# Patient Record
Sex: Male | Born: 1955 | Race: White | Hispanic: No | Marital: Married | Smoking: Never smoker
Health system: Southern US, Community
[De-identification: ages and names within clinical notes are randomized; demographics above are authoritative.]

## PROBLEM LIST (undated history)

## (undated) DIAGNOSIS — W868XXA Exposure to other electric current, initial encounter: Secondary | ICD-10-CM

## (undated) DIAGNOSIS — G4730 Sleep apnea, unspecified: Secondary | ICD-10-CM

## (undated) DIAGNOSIS — T300 Burn of unspecified body region, unspecified degree: Secondary | ICD-10-CM

## (undated) DIAGNOSIS — I10 Essential (primary) hypertension: Secondary | ICD-10-CM

## (undated) DIAGNOSIS — E7800 Pure hypercholesterolemia, unspecified: Secondary | ICD-10-CM

## (undated) HISTORY — PX: CATARACT EXTRACTION: SUR2

## (undated) HISTORY — PX: OTHER SURGICAL HISTORY: SHX169

## (undated) HISTORY — PX: TENDON REPAIR: SHX5111

## (undated) HISTORY — PX: CARPAL TUNNEL RELEASE: SHX101

## (undated) HISTORY — PX: SHOULDER SURGERY: SHX246

## (undated) HISTORY — PX: HIP SURGERY: SHX245

---

## 1998-09-11 ENCOUNTER — Ambulatory Visit: Admission: RE | Admit: 1998-09-11 | Discharge: 1998-09-11 | Payer: Self-pay | Admitting: Family Medicine

## 2000-01-11 ENCOUNTER — Emergency Department (HOSPITAL_COMMUNITY): Admission: EM | Admit: 2000-01-11 | Discharge: 2000-01-12 | Payer: Self-pay | Admitting: Emergency Medicine

## 2002-09-23 DIAGNOSIS — T300 Burn of unspecified body region, unspecified degree: Secondary | ICD-10-CM

## 2002-09-23 HISTORY — DX: Burn of unspecified body region, unspecified degree: T30.0

## 2003-07-12 ENCOUNTER — Emergency Department (HOSPITAL_COMMUNITY): Admission: AC | Admit: 2003-07-12 | Discharge: 2003-07-12 | Payer: Self-pay

## 2003-07-12 ENCOUNTER — Encounter: Payer: Self-pay | Admitting: Emergency Medicine

## 2003-09-24 HISTORY — PX: BACK SURGERY: SHX140

## 2004-01-22 HISTORY — PX: OTHER SURGICAL HISTORY: SHX169

## 2006-08-06 ENCOUNTER — Ambulatory Visit (HOSPITAL_COMMUNITY): Admission: RE | Admit: 2006-08-06 | Discharge: 2006-08-06 | Payer: Self-pay | Admitting: Gastroenterology

## 2007-02-22 HISTORY — PX: OTHER SURGICAL HISTORY: SHX169

## 2007-03-12 ENCOUNTER — Ambulatory Visit (HOSPITAL_BASED_OUTPATIENT_CLINIC_OR_DEPARTMENT_OTHER): Admission: RE | Admit: 2007-03-12 | Discharge: 2007-03-12 | Payer: Self-pay | Admitting: Orthopedic Surgery

## 2008-06-08 ENCOUNTER — Emergency Department (HOSPITAL_COMMUNITY): Admission: EM | Admit: 2008-06-08 | Discharge: 2008-06-08 | Payer: Self-pay | Admitting: Emergency Medicine

## 2008-09-23 HISTORY — PX: MENISCUS REPAIR: SHX5179

## 2009-11-08 ENCOUNTER — Ambulatory Visit (HOSPITAL_BASED_OUTPATIENT_CLINIC_OR_DEPARTMENT_OTHER): Admission: RE | Admit: 2009-11-08 | Discharge: 2009-11-08 | Payer: Self-pay | Admitting: Family Medicine

## 2009-11-19 ENCOUNTER — Ambulatory Visit: Payer: Self-pay | Admitting: Internal Medicine

## 2010-09-01 ENCOUNTER — Ambulatory Visit (HOSPITAL_COMMUNITY)
Admission: RE | Admit: 2010-09-01 | Discharge: 2010-09-02 | Payer: Self-pay | Source: Home / Self Care | Attending: Orthopedic Surgery | Admitting: Orthopedic Surgery

## 2010-12-03 LAB — GLUCOSE, CAPILLARY
Glucose-Capillary: 133 mg/dL — ABNORMAL HIGH (ref 70–99)
Glucose-Capillary: 144 mg/dL — ABNORMAL HIGH (ref 70–99)

## 2010-12-03 LAB — COMPREHENSIVE METABOLIC PANEL
ALT: 61 U/L — ABNORMAL HIGH (ref 0–53)
AST: 35 U/L (ref 0–37)
Albumin: 4.3 g/dL (ref 3.5–5.2)
Alkaline Phosphatase: 51 U/L (ref 39–117)
BUN: 14 mg/dL (ref 6–23)
CO2: 27 mEq/L (ref 19–32)
Calcium: 9.5 mg/dL (ref 8.4–10.5)
Chloride: 106 mEq/L (ref 96–112)
Creatinine, Ser: 0.93 mg/dL (ref 0.4–1.5)
GFR calc Af Amer: >60 mL/min (ref >60)
GFR calc non Af Amer: >60 mL/min (ref >60)
Glucose, Bld: 127 mg/dL — ABNORMAL HIGH (ref 70–99)
Potassium: 4.4 mEq/L (ref 3.5–5.1)
Sodium: 139 mEq/L (ref 135–145)
Total Bilirubin: 0.8 mg/dL (ref 0.3–1.2)
Total Protein: 6.6 g/dL (ref 6.0–8.3)

## 2010-12-03 LAB — CBC
HCT: 40.5 % (ref 39.0–52.0)
Hemoglobin: 14.2 g/dL (ref 13.0–17.0)
MCH: 30.2 pg (ref 26.0–34.0)
MCHC: 35.1 g/dL (ref 30.0–36.0)
MCV: 86.2 fL (ref 78.0–100.0)
Platelets: 225 10*3/uL (ref 150–400)
RBC: 4.7 MIL/uL (ref 4.22–5.81)
RDW: 13.2 % (ref 11.5–15.5)
WBC: 5.3 10*3/uL (ref 4.0–10.5)

## 2010-12-03 LAB — DIFFERENTIAL
Basophils Absolute: 0 10*3/uL (ref 0.0–0.1)
Basophils Relative: 1 % (ref 0–1)
Eosinophils Absolute: 0.1 10*3/uL (ref 0.0–0.7)
Eosinophils Relative: 1 % (ref 0–5)
Lymphocytes Relative: 36 % (ref 12–46)
Lymphs Abs: 1.9 10*3/uL (ref 0.7–4.0)
Monocytes Absolute: 0.7 10*3/uL (ref 0.1–1.0)
Monocytes Relative: 12 % (ref 3–12)
Neutro Abs: 2.7 10*3/uL (ref 1.7–7.7)
Neutrophils Relative %: 50 % (ref 43–77)

## 2010-12-03 LAB — PROTIME-INR
INR: 0.93 (ref 0.00–1.49)
Prothrombin Time: 12.7 seconds (ref 11.6–15.2)

## 2011-02-05 NOTE — Op Note (Signed)
[name], [name]                 ACCOUNT NO.:  [id number]   MEDICAL RECORD NO.:  [id number]          PATIENT TYPE:  AMB   LOCATION:  DSC                          FACILITY:  [facility]   PHYSICIAN:  Cindee Salt, M.D.       DATE OF BIRTH:  07-18-1956   DATE OF PROCEDURE:  03/12/2007  DATE OF DISCHARGE:                               OPERATIVE REPORT   PREOPERATIVE DIAGNOSIS:  Status post ulnar nerve decompression, right  elbow.   POSTOPERATIVE DIAGNOSIS:  Status post ulnar nerve decompression, right  elbow.   OPERATION:  Submuscular transposition, right elbow.   SURGEON:  Kuzma.   ASSISTANT:  __________ R.N.   ANESTHESIA:  General.   ANESTHESIOLOGIST:  Fitzgerald.   HISTORY:  The patient is a 55 year old male with a history of ulnar  wrist pain, ulnar pain in his hand and forearm.  He has undergone a  decompressive release of his ulnar nerve.  He has marked changes on his  nerve conductions with continued symptoms.  He is desirous of  submuscular transposition.  He is aware of risks and complications  including infection; recurrence; injury to arteries, nerves, tendons;  incomplete relief of symptoms; dystrophy; possibility of  devascularization of the nerve.  Questions were encouraged and answered.  In the preoperative area ,the patient is seen, questions again  encouraged and answered.  The extremity marked by both the patient and  surgeon.   PROCEDURE:  The patient was brought to the operating room where a  general anesthetic was carried out without difficulty, was prepped using  DuraPrep, supine position, right arm free.  The limb was exsanguinated  with an Esmarch bandage.  Tourniquet placed high on the arm, was  inflated to 300 mmHg.  A long curvilinear incision was made over the  medial aspect of the elbow, carried down through subcutaneous tissue.  Bleeders were electrocauterized.  Significant scarring was present about  the entire nerve.  This was then traced distally.   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  Stimulation of the nerve proximally produced  flexion of the ring and little fingers distally.  The dissection was  then carried distally.  Significant scarring was present about the  nerve.  After the neuroma was freed, the flexor carpi ulnaris was then  opened, a fasciotomy performed.  The medial intermuscular septum was  removed.  The medial musculature was then elevated off from the medial  epicondyle, taking care to protect the medial collateral ligament.  This  was done over to the level of the brachial artery and median nerve.  The  vessels present with the ulnar nerve had been sacrificed and were no  longer patent.  As such, the nerve was neurolysed.  This was anteriorly  transposed.  The drill holes were then placed in the medial epicondyle  for reattachment of the medial musculature.  This was then done with 2-0  FiberWire.  A flap of tissue scar from the posterior aspect of the  epicondyle was then used to reinforce the repair anteriorly in the  slightly lengthened position.  This allowed the nerve to be anteriorly  transposed.  Flexion/extension revealed adequate movement of the nerve  beneath the repair site.  The wound was copiously irrigated with saline.  The subcutaneous tissue was closed with interrupted 2-0 Vicryl sutures  and the skin with interrupted 4-0 Vicryl Rapide sutures.  Sterile  compressive dressing  and long-arm splint were applied.  The patient tolerated the procedure  well and was taken to the recovery observation in satisfactory  condition.  He will be discharged home to return to the [facility] in 1 week on Percocet.           ______________________________  Cindee Salt, M.D.     GK/MEDQ  D:  03/12/2007  T:  03/12/2007  Job:  [id number]   cc:   Cindee Salt, M.D.  Marjory Lies, M.D.

## 2011-02-08 NOTE — Op Note (Signed)
[name], [name]                 ACCOUNT NO.:  [id number]   MEDICAL RECORD NO.:  [id number]          PATIENT TYPE:  AMB   LOCATION:  ENDO                         FACILITY:  [facility]   PHYSICIAN:  Anselmo Rod, M.D.  DATE OF BIRTH:  07/05/1956   DATE OF PROCEDURE:  08/06/2006  DATE OF DISCHARGE:                                 OPERATIVE REPORT   PROCEDURE PERFORMED:  Screening colonoscopy.   ENDOSCOPIST:  Anselmo Rod, M.D.   INSTRUMENT USED:  Olympus colonoscope.   INDICATIONS FOR PROCEDURE:  55 year old white male with a history of rectal  bleeding undergoing screening colonoscopy to rule out colonic polyps,  masses, etc.   PREPROCEDURE PREPARATION:  Informed consent was procured from the patient.  The patient was fasted for 8 hours prior to the procedure and prepped with a  gallon of Trilyte the night prior to the procedure.  The risks and benefits  of the procedure including a 10% miss rate of cancer and polyps were  discussed with the patient, as well.   PREPROCEDURE PHYSICAL:  The patient had stable vital signs.  Chest clear to  auscultation.  S1 and S2 regular.  Abdomen soft with normal bowel sounds.   DESCRIPTION OF PROCEDURE:  The patient was placed in the left lateral  decubitus position and sedated with 125 mcg of fentanyl and 14 mg of Versed  given intravenously in slow incremental doses.  Once the patient was  adequately sedated, maintained on low-flow oxygen and continuous cardiac  monitoring, the Olympus video colonoscope was advanced from the rectum to  the cecum.  There was a large amount of residual stool in the colon,  multiple washes were done.  The patient's position was changed from the left  lateral to the supine and the left lateral position with gentle application  of abdominal pressure to reach the cecal base.  The appendiceal orifice and  ileocecal valve were clearly visualized and photographed.  No masses,  polyps, erosions, ulcers, or  diverticula seen.  Small internal hemorrhoids  were seen on retroflexion in the rectum.  The patient tolerated the  procedure well without immediate complications.   IMPRESSION:  Normal colonoscopy except for small internal hemorrhoids seen  on retroflexion, no masses, polyps, or diverticula noted.   RECOMMENDATIONS:  1. Continue a high fiber diet liberal fluid intake.  2. Repeat colonoscopy in the next five years unless the patient develops      any abnormal symptoms in the interim.  3. Outpatient follow-up in the next two weeks for further recommendations.      Anselmo Rod, M.D.  Electronically Signed     JNM/MEDQ  D:  08/06/2006  T:  08/07/2006  Job:  [id number]   cc:   Marjory Lies, M.D.

## 2011-07-10 LAB — BASIC METABOLIC PANEL
CO2: 28
Glucose, Bld: 111 — ABNORMAL HIGH
Potassium: 4.7
Sodium: 140

## 2012-03-15 ENCOUNTER — Encounter (HOSPITAL_COMMUNITY): Payer: Self-pay | Admitting: Emergency Medicine

## 2012-03-15 ENCOUNTER — Emergency Department (HOSPITAL_COMMUNITY): Payer: 59

## 2012-03-15 ENCOUNTER — Emergency Department (HOSPITAL_COMMUNITY)
Admission: EM | Admit: 2012-03-15 | Discharge: 2012-03-16 | Disposition: A | Discharge status: Home / Self Care | Payer: 59 | Attending: Emergency Medicine | Admitting: Emergency Medicine

## 2012-03-15 DIAGNOSIS — Z7982 Long term (current) use of aspirin: Secondary | ICD-10-CM | POA: Insufficient documentation

## 2012-03-15 DIAGNOSIS — M25519 Pain in unspecified shoulder: Secondary | ICD-10-CM | POA: Insufficient documentation

## 2012-03-15 DIAGNOSIS — C787 Secondary malignant neoplasm of liver and intrahepatic bile duct: Secondary | ICD-10-CM | POA: Insufficient documentation

## 2012-03-15 DIAGNOSIS — I10 Essential (primary) hypertension: Secondary | ICD-10-CM | POA: Insufficient documentation

## 2012-03-15 DIAGNOSIS — C801 Malignant (primary) neoplasm, unspecified: Secondary | ICD-10-CM | POA: Insufficient documentation

## 2012-03-15 DIAGNOSIS — E7800 Pure hypercholesterolemia, unspecified: Secondary | ICD-10-CM | POA: Insufficient documentation

## 2012-03-15 DIAGNOSIS — E119 Type 2 diabetes mellitus without complications: Secondary | ICD-10-CM | POA: Insufficient documentation

## 2012-03-15 DIAGNOSIS — G8929 Other chronic pain: Secondary | ICD-10-CM | POA: Insufficient documentation

## 2012-03-15 DIAGNOSIS — Z79899 Other long term (current) drug therapy: Secondary | ICD-10-CM | POA: Insufficient documentation

## 2012-03-15 DIAGNOSIS — R109 Unspecified abdominal pain: Secondary | ICD-10-CM

## 2012-03-15 DIAGNOSIS — R1011 Right upper quadrant pain: Secondary | ICD-10-CM | POA: Insufficient documentation

## 2012-03-15 HISTORY — DX: Burn of unspecified body region, unspecified degree: T30.0

## 2012-03-15 HISTORY — DX: Pure hypercholesterolemia, unspecified: E78.00

## 2012-03-15 HISTORY — DX: Essential (primary) hypertension: I10

## 2012-03-15 HISTORY — DX: Exposure to other electric current, initial encounter: W86.8XXA

## 2012-03-15 LAB — CBC
Hemoglobin: 11.2 g/dL — ABNORMAL LOW (ref 13.0–17.0)
RBC: 3.88 MIL/uL — ABNORMAL LOW (ref 4.22–5.81)

## 2012-03-15 LAB — URINALYSIS, ROUTINE W REFLEX MICROSCOPIC
Glucose, UA: NEGATIVE mg/dL
Hgb urine dipstick: NEGATIVE
Leukocytes, UA: NEGATIVE
Specific Gravity, Urine: 1.022 (ref 1.005–1.030)
Urobilinogen, UA: 1 mg/dL (ref 0.0–1.0)

## 2012-03-15 LAB — DIFFERENTIAL
Basophils Relative: 1 % (ref 0–1)
Lymphs Abs: 1.4 10*3/uL (ref 0.7–4.0)
Monocytes Relative: 12 % (ref 3–12)
Neutro Abs: 5.8 10*3/uL (ref 1.7–7.7)
Neutrophils Relative %: 70 % (ref 43–77)

## 2012-03-15 LAB — COMPREHENSIVE METABOLIC PANEL
BUN: 17 mg/dL (ref 6–23)
Calcium: 9.1 mg/dL (ref 8.4–10.5)
Creatinine, Ser: 0.78 mg/dL (ref 0.50–1.35)
GFR calc Af Amer: >90 mL/min (ref >90)
Glucose, Bld: 111 mg/dL — ABNORMAL HIGH (ref 70–99)
Sodium: 138 mEq/L (ref 135–145)
Total Protein: 6.6 g/dL (ref 6.0–8.3)

## 2012-03-15 LAB — LIPASE, BLOOD: Lipase: 18 U/L (ref 11–59)

## 2012-03-15 MED ORDER — SODIUM CHLORIDE 0.9 % IV BOLUS (SEPSIS)
500.0000 mL | Freq: Once | INTRAVENOUS | Status: AC
  Administered 2012-03-15: 500 mL via INTRAVENOUS

## 2012-03-15 MED ORDER — ONDANSETRON HCL 4 MG/2ML IJ SOLN
4.0000 mg | Freq: Once | INTRAMUSCULAR | Status: AC
  Administered 2012-03-15: 4 mg via INTRAVENOUS
  Filled 2012-03-15: qty 2

## 2012-03-15 MED ORDER — HYDROMORPHONE HCL PF 2 MG/ML IJ SOLN
2.0000 mg | Freq: Once | INTRAMUSCULAR | Status: AC
  Administered 2012-03-15: 2 mg via INTRAVENOUS
  Filled 2012-03-15: qty 1

## 2012-03-15 NOTE — ED Notes (Signed)
Reports R shoulder pain that started yesterday at 4am.  States pain gradually moved down to R ribs/R side and into R abd.  Denies nausea and vomiting.
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
## 2012-03-15 NOTE — ED Notes (Signed)
Pt arrived to room at this time

## 2012-03-15 NOTE — ED Notes (Signed)
Patient transported to Ultrasound 

## 2012-03-16 ENCOUNTER — Other Ambulatory Visit (HOSPITAL_COMMUNITY): Payer: Self-pay | Admitting: Family Medicine

## 2012-03-16 ENCOUNTER — Emergency Department (HOSPITAL_COMMUNITY): Payer: 59

## 2012-03-16 DIAGNOSIS — R945 Abnormal results of liver function studies: Secondary | ICD-10-CM

## 2012-03-16 MED ORDER — HYDROCODONE-ACETAMINOPHEN 5-325 MG PO TABS
2.0000 | ORAL_TABLET | Freq: Once | ORAL | Status: AC
  Administered 2012-03-16: 2 via ORAL
  Filled 2012-03-16: qty 2

## 2012-03-16 MED ORDER — ALPRAZOLAM 0.25 MG PO TABS
1.0000 mg | ORAL_TABLET | Freq: Once | ORAL | Status: AC
  Administered 2012-03-16: 1 mg via ORAL
  Filled 2012-03-16: qty 4

## 2012-03-16 MED ORDER — ALPRAZOLAM 0.5 MG PO TABS: 0.5000 mg | ORAL_TABLET | Freq: Every evening | ORAL | Status: AC | PRN

## 2012-03-16 MED ORDER — HYDROCODONE-ACETAMINOPHEN 5-325 MG PO TABS: 2.0000 | ORAL_TABLET | ORAL | Status: AC | PRN

## 2012-03-16 NOTE — ED Provider Notes (Signed)
Medical screening examination/treatment/procedure(s) were performed by non-physician practitioner and as supervising physician I was immediately available for consultation/collaboration.   Joya Gaskins, MD 03/16/12 [phone]

## 2012-03-16 NOTE — ED Notes (Signed)
Pt denies any pain or questions upon discharge, pt verbalizes understanding of no driving to medications given and prescriptions.

## 2012-03-16 NOTE — Discharge Instructions (Signed)
The ultrasound findings that are highly suggestive of metastatic liver cancer needs to be followed up closely and more thoroughly by your primary care provider and specialists for further evaluation and management. The information below addresses facts about liver cancer in general however it is an impossible to tell based on ultrasound findings an exact diagnosis. Return to emergency department at any time for emergent changing or worsening symptoms.  Liver Cancer Cells divide to form new cells when the body needs them. That happens in the liver, just as it does in other organs. Sometimes, the cells divide too rapidly. Old cells do not die off, and the process gets out of control. A growth (tumor) forms. That is how liver cancer starts. The liver is an important organ of the body. It is located on the upper right side of the belly (abdomen), just below the ribs. It is the largest organ in the body. In an adult man, it is about the size of a football. The liver stores sugar and iron. It also cleans (filters) harmful substances out of the blood. CAUSES  Scientists do not know exactly why cells start to divide rapidly in the liver to form a tumor. It is known that certain behaviors and conditions (risk factors) make liver cancer more likely to develop. They include:  Being male. Men older than 50 have liver cancer more often than other people do.   Having scarring of the liver (cirrhosis).   This occurs when liver cells are damaged and replaced with scar tissue.   Heavy drinking of alcohol for many years can cause cirrhosis, as well as being infected with the hepatitis B or hepatitis C virus (HBV or HCV). HBV and HCV are spread through blood and sexual contact.   Having certain liver diseases, such as:   Hemochromatosis. This disease causes too much iron to be stored in the liver.   Autoimmune hepatitis. In this disease, the body's immune system turns against the liver.   Wilson's disease. This  disease happens when copper builds up in the liver.   Having diabetes, particularly if you also drink alcohol heavily or have chronic viral hepatitis.   Being overweight (obese).   Exposure to alfatoxins. These are substances made by certain types of mold. They can form on peanuts, corn, wheat, soybeans, and rice. This is not a common cause of liver cancer in the [place] and [place] where foods are tested for alfatoxins.  SYMPTOMS  Often times, liver cancer often has few symptoms in the beginning. Sometimes, there are none. As the cancer grows, symptoms may include:  Weight loss without dieting.   Loss of appetite.   Nausea.   Feeling very weak and tired.   Pain on the right side of the belly (abdomen).   Feeling full or bloated.   Running a fever for no reason.   The skin or eyes become yellow in color (jaundice).   Dark-colored urine.  DIAGNOSIS  Your caregiver may suspect that you have liver cancer based on your symptoms and your physical exam. Other tests will likely be necessary. These may include:  Blood tests, including a test called alpha-fetoprotein (AFP). The blood contains more of this protein if someone has liver cancer.   Imaging tests. These tests may be able to detect cancer or a tumor. These tests include CT scan, MRI scan, or ultrasound.   Liver biopsy. Using medicine to numb your skin, a specialist can insert a needle into the liver and remove some  cells. These cells are examined under a microscope to determine if cancer is present. This is the best way to be certain of the diagnosis.  TREATMENT  Liver cancer can be treated many ways. The type of treatment will depend on:  The stage of the cancer.   Your age and overall health.   The condition of the liver. This means how well it is working and whether cirrhosis is present.  Treatments can be used alone or in combination. Options include:  Surgery.   The part of the liver that has cancer may be  removed.   The whole liver may be removed. It would be replaced with a healthy liver (liver transplant).   Radiation. High-energy X-rays kill the cancer cells.   External radiation beams rays from a machine outside the body.   Internal radiation uses tiny spheres (like beads) that are put into the body. They give off radiation.   Chemotherapy. This treatment uses drugs that kill cancer cells. Options include:   Intravenous chemotherapy. Drugs are put into the blood to travel through the body.   Targeted chemotherapy. This uses a drug that kills liver cancer by blocking the cancer's blood supply.   Chemoembolization. Drugs are injected directly into the liver to kill cancer cells.   Cryoablation. This involves killing the cancer cells by freezing them.   Radiofrequency ablation. This procedure kills the cancer cells by heating them with an electric current.  HOME CARE INSTRUCTIONS   Learn as much as you can about liver cancer. It is important to be an informed patient.   Work closely with your caregivers. Fighting cancer takes a team approach.   Take medicines for pain only as prescribed by your caregiver. Follow the directions carefully. Ask before taking any over-the-counter drugs. Be aware that medicines containing acetaminophen can add to liver damage.   Pay attention to what you eat. Nutrition is an important part of recovering from liver cancer. Having this cancer can take away your appetite. So can some of the treatments for it. You might need to limit salt. It is also important to get the right amount of protein. Ask your caregiver for advice or talk with a nutritionist.   Consider joining a support group. Learning to live with a serious health problem, such as liver cancer, can be difficult. Friends and family can help. Many people also find it helpful to talk with others who are going through the same things you are. Ask your caregiver for a list of groups in your area.    Get rest.   Do not drink alcoholic beverages at all.   Get vaccinated against hepatitis A and hepatitis B. There is no vaccine for hepatitis C. If you are considered at risk for these diseases, get tested.  SEEK MEDICAL CARE IF:   You cannot eat because you feel sick to your stomach.   Your abdomen or legs start to swell. Fluid might be building up.   You feel weaker or more tired than usual.   Your pain gets worse.  SEEK IMMEDIATE MEDICAL CARE IF:   Your pain in your abdomen increases suddenly.   You have nausea, vomiting, or diarrhea that does not go away.   You feel confused.   You feel very sleepy during the daytime.   You have any bleeding that does not stop quickly.   You have a fever.  Document Released: 01/26/2009 Document Revised: 08/29/2011 Document Reviewed: 01/26/2009 [facility] Patient Information 2012 [place], [place].

## 2012-03-17 ENCOUNTER — Encounter (HOSPITAL_COMMUNITY): Payer: Self-pay | Admitting: Pharmacy Technician

## 2012-03-17 ENCOUNTER — Other Ambulatory Visit: Payer: Self-pay | Admitting: Radiology

## 2012-03-19 ENCOUNTER — Ambulatory Visit (HOSPITAL_COMMUNITY)
Admission: RE | Admit: 2012-03-19 | Discharge: 2012-03-19 | Disposition: A | Discharge status: Home / Self Care | Payer: 59 | Source: Ambulatory Visit | Attending: Family Medicine | Admitting: Family Medicine

## 2012-03-19 ENCOUNTER — Other Ambulatory Visit (HOSPITAL_COMMUNITY): Payer: Self-pay | Admitting: Family Medicine

## 2012-03-19 ENCOUNTER — Encounter (HOSPITAL_COMMUNITY): Payer: Self-pay

## 2012-03-19 DIAGNOSIS — R945 Abnormal results of liver function studies: Secondary | ICD-10-CM

## 2012-03-19 HISTORY — DX: Sleep apnea, unspecified: G47.30

## 2012-03-19 LAB — PROTIME-INR
INR: 1.08 (ref 0.00–1.49)
Prothrombin Time: 14.2 seconds (ref 11.6–15.2)

## 2012-03-19 LAB — CBC
HCT: 31.9 % — ABNORMAL LOW (ref 39.0–52.0)
RDW: 12.9 % (ref 11.5–15.5)
WBC: 8 10*3/uL (ref 4.0–10.5)

## 2012-03-19 LAB — APTT: aPTT: 37 seconds (ref 24–37)

## 2012-03-19 MED ORDER — HYDROMORPHONE HCL 4 MG PO TABS
4.0000 mg | ORAL_TABLET | Freq: Once | ORAL | Status: DC
  Filled 2012-03-19: qty 1

## 2012-03-19 MED ORDER — FENTANYL CITRATE 0.05 MG/ML IJ SOLN
INTRAMUSCULAR | Status: AC
  Filled 2012-03-19: qty 2

## 2012-03-19 MED ORDER — MIDAZOLAM HCL 2 MG/2ML IJ SOLN
INTRAMUSCULAR | Status: AC
  Filled 2012-03-19: qty 4

## 2012-03-19 MED ORDER — SODIUM CHLORIDE 0.9 % IV SOLN
Freq: Once | INTRAVENOUS | Status: AC
  Administered 2012-03-19: 12:00:00 via INTRAVENOUS

## 2012-03-19 MED ORDER — ACETAMINOPHEN 325 MG PO TABS
650.0000 mg | ORAL_TABLET | Freq: Four times a day (QID) | ORAL | Status: AC | PRN
  Administered 2012-03-19: 650 mg via ORAL
  Filled 2012-03-19: qty 2

## 2012-03-19 MED ORDER — FENTANYL CITRATE 0.05 MG/ML IJ SOLN
INTRAMUSCULAR | Status: AC
  Filled 2012-03-19: qty 4

## 2012-03-19 MED ORDER — MIDAZOLAM HCL 2 MG/2ML IJ SOLN
INTRAMUSCULAR | Status: AC
  Filled 2012-03-19: qty 2

## 2012-03-19 MED ORDER — ACETAMINOPHEN 325 MG PO TABS
ORAL_TABLET | ORAL | Status: AC
  Filled 2012-03-19: qty 2

## 2012-03-19 MED ORDER — FENTANYL CITRATE 0.05 MG/ML IJ SOLN
INTRAMUSCULAR | Status: AC | PRN
  Administered 2012-03-19: 100 ug via INTRAVENOUS

## 2012-03-19 MED ORDER — MIDAZOLAM HCL 5 MG/5ML IJ SOLN
INTRAMUSCULAR | Status: AC | PRN
  Administered 2012-03-19: 4 mg via INTRAVENOUS

## 2012-03-19 NOTE — Discharge Instructions (Signed)
Liver Biopsy Care After Refer to this sheet in the next few weeks. These discharge instructions provide you with general information on caring for yourself after you leave the hospital. Your caregiver may also give you specific instructions. Your treatment has been planned according to the most current medical practices available, but unavoidable complications sometimes occur. If you have any problems or questions after discharge, please call your caregiver. HOME CARE INSTRUCTIONS   You should rest for 1 to 2 days or as instructed.   If you go home the same day as your procedure (outpatient), have a responsible adult take you home and stay with you overnight.   Do not lift more than 5 pounds or play contact sports for 2 weeks.   Do not drive for 24 hours after this test.   Do not take medicine containing aspirin or drink alcohol for 1 week after this test.   Change bandages (dressings) as directed.   Only take over-the-counter or prescription medicines for pain, discomfort, or fever as directed by your caregiver.  OBTAINING YOUR TEST RESULTS Not all test results are available during your visit. If your test results are not back during the visit, make an appointment with your caregiver to find out the results. Do not assume everything is normal if you have not heard from your caregiver or the medical facility. It is important for you to follow up on all of your test results. SEEK MEDICAL CARE IF:   You have increased bleeding (more than a small spot) from the biopsy site.   You have redness, swelling, or increasing pain in the biopsy site.   You have an oral temperature above 102 F (38.9 C).  SEEK IMMEDIATE MEDICAL CARE IF:   You develop swelling or pain in the belly (abdomen).   You develop a rash.   You have difficulty breathing, feel short of breath, or feel faint.   You develop any reaction or side effects to medicines given.  MAKE SURE YOU:   Understand these instructions.    Will watch your condition.   Will get help right away if you are not doing well or get worse.  Document Released: 03/29/2005 Document Revised: 08/29/2011 Document Reviewed: 04/21/2008 [facility] Patient Information 2012 [place], [place].  Moderate Sedation, Adult Moderate sedation is given to help you relax or even sleep through a procedure. You may remain sleepy, be clumsy, or have poor balance for several hours following this procedure. Arrange for a responsible adult, family member, or friend to take you home. A responsible adult should stay with you for at least 24 hours or until the medicines have worn off.  Do not participate in any activities where you could become injured for the next 24 hours, or until you feel normal again. Do not:   Drive.   Swim.   Ride a bicycle.   Operate heavy machinery.   Cook.   Use power tools.   Climb ladders.   Work at [employer].   Do not make important decisions or sign legal documents until you are improved.   Vomiting may occur if you eat too soon. When you can drink without vomiting, try water, juice, or soup. Try solid foods if you feel little or no nausea.   Only take over-the-counter or prescription medications for pain, discomfort, or fever as directed by your caregiver.If pain medications have been prescribed for you, ask your caregiver how soon it is safe to take them.   Make sure you and your family  fully understands everything about the medication given to you. Make sure you understand what side effects may occur.   You should not drink alcohol, take sleeping pills, or medications that cause drowsiness for at least 24 hours.   If you smoke, do not smoke alone.   If you are feeling better, you may resume normal activities 24 hours after receiving sedation.   Keep all appointments as scheduled. Follow all instructions.   Ask questions if you do not understand.  SEEK MEDICAL CARE IF:   Your skin is pale or bluish in color.    You continue to feel sick to your stomach (nauseous) or throw up (vomit).   Your pain is getting worse and not helped by medication.   You have bleeding or swelling.   You are still sleepy or feeling clumsy after 24 hours.  SEEK IMMEDIATE MEDICAL CARE IF:   You develop a rash.   You have difficulty breathing.   You develop any type of allergic problem.   You have a fever.  Document Released: 06/04/2001 Document Revised: 08/29/2011 Document Reviewed: 10/26/2007 [facility] Patient Information 2012 [place], [place].

## 2012-03-19 NOTE — Progress Notes (Signed)
HOB 45 degrees and pt wanting to eat

## 2012-03-19 NOTE — Progress Notes (Signed)
Pt diaphoresing. States he has fevers and diaphoressng at home. Linen changed. Temp 99.2Pt slightly flushed States he is feeling better

## 2012-03-19 NOTE — Procedures (Signed)
Liver Bx Core

## 2012-03-19 NOTE — Progress Notes (Signed)
Dr Bonnielee Haff here to see pt. Pt took home med of Dilaudid 4mg  .
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
## 2012-03-25 ENCOUNTER — Inpatient Hospital Stay (HOSPITAL_COMMUNITY): Admission: RE | Admit: 2012-03-25 | Payer: Self-pay | Source: Ambulatory Visit

## 2012-03-25 ENCOUNTER — Other Ambulatory Visit (HOSPITAL_COMMUNITY)
Admission: RE | Admit: 2012-03-25 | Discharge: 2012-03-25 | Disposition: A | Discharge status: Home / Self Care | Payer: 59 | Source: Ambulatory Visit | Attending: Anatomic Pathology & Clinical Pathology | Admitting: Anatomic Pathology & Clinical Pathology

## 2012-03-25 DIAGNOSIS — C228 Malignant neoplasm of liver, primary, unspecified as to type: Secondary | ICD-10-CM | POA: Insufficient documentation

## 2013-10-24 DEATH — deceased

## 2013-11-29 ENCOUNTER — Telehealth: Payer: Self-pay

## 2013-11-29 NOTE — Telephone Encounter (Signed)
Patient past away @ [facility] per Iver Nestle in [place] [address]

## 2013-12-23 IMAGING — US US ABDOMEN COMPLETE
1 series · 13 of 25 positions shown · non-contrast
Comparison: None.

CLINICAL DATA: Right upper quadrant pain radiating to the scapula
for 3 days.

COMPLETE ABDOMINAL ULTRASOUND

[Series 1: us abdomen complete · 0.31mm/px · 13 of 104 slices shown]
[im 1/104]
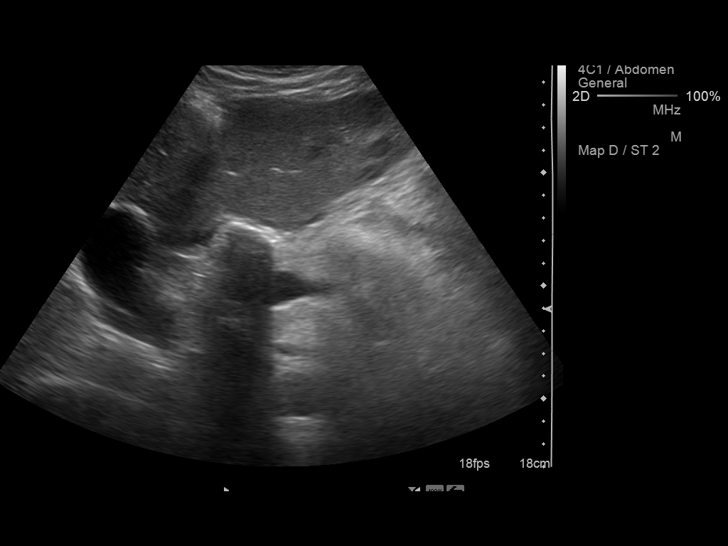
[im 9/104]
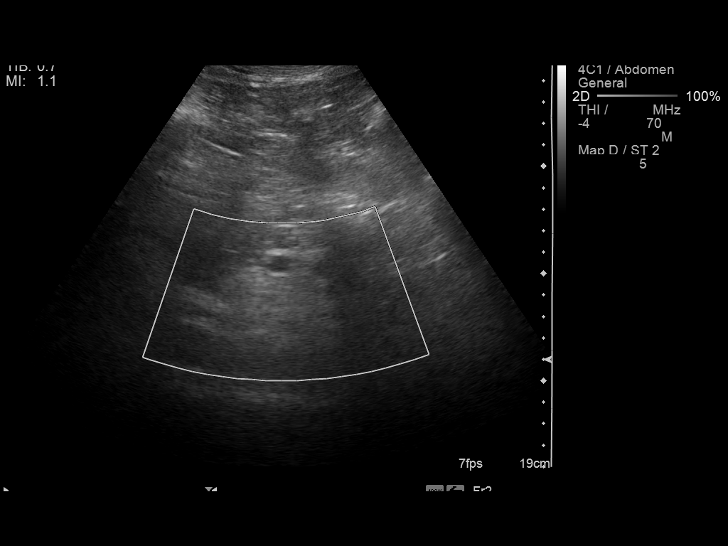
[im 18/104]
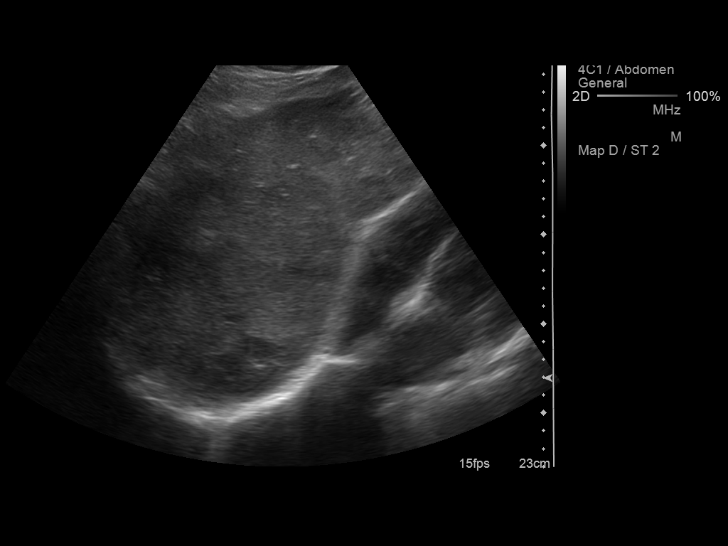
[im 26/104]
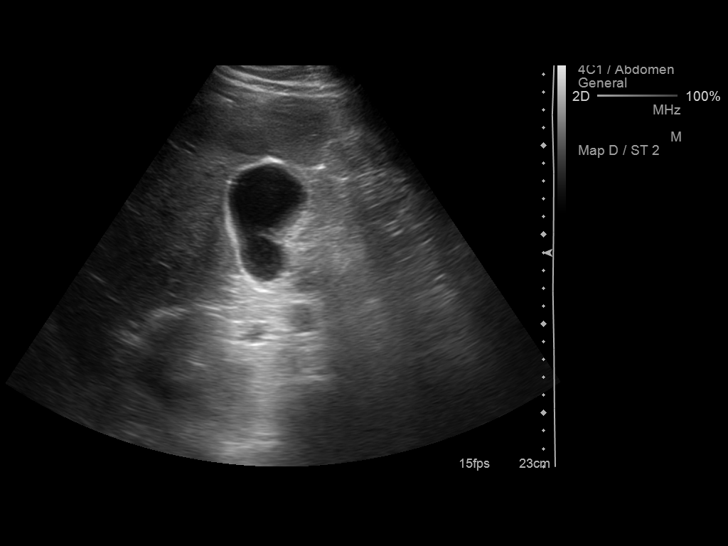
[im 35/104]
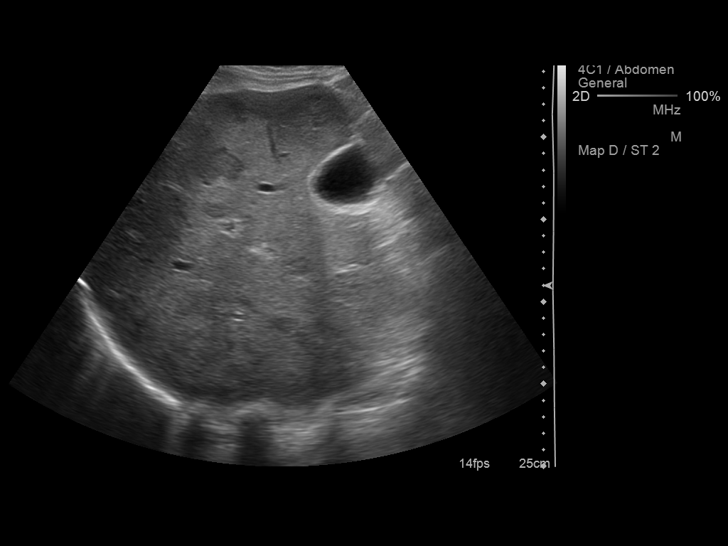
[im 43/104]
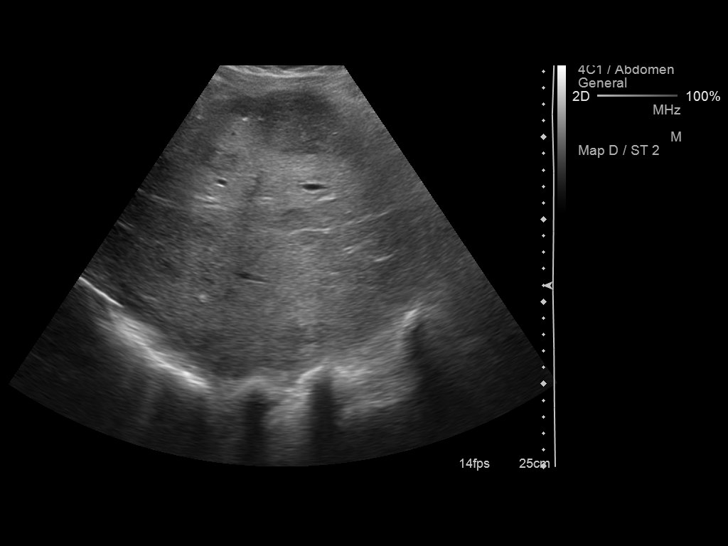
[im 52/104]
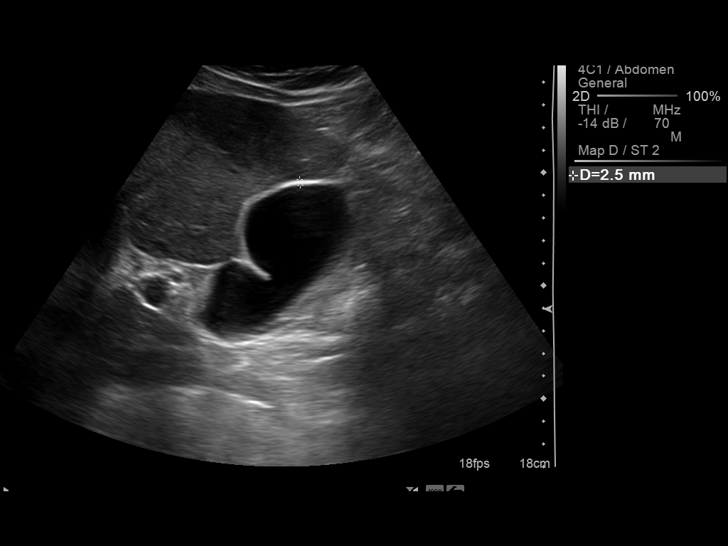
[im 61/104]
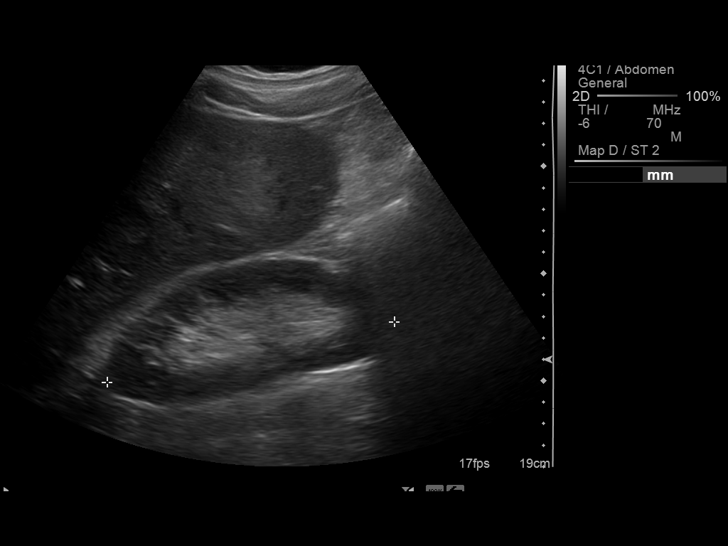
[im 69/104]
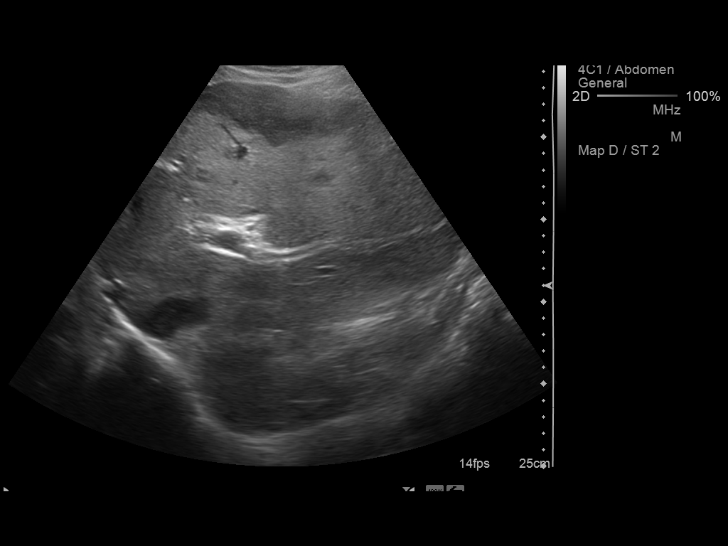
[im 78/104]
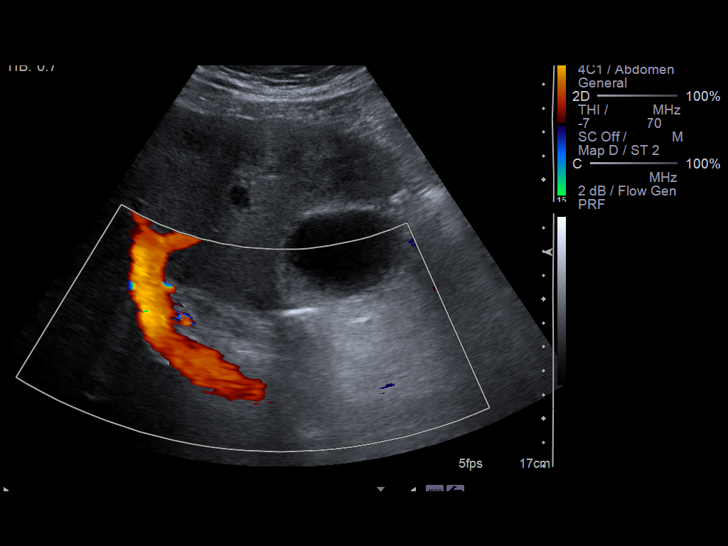
[im 86/104]
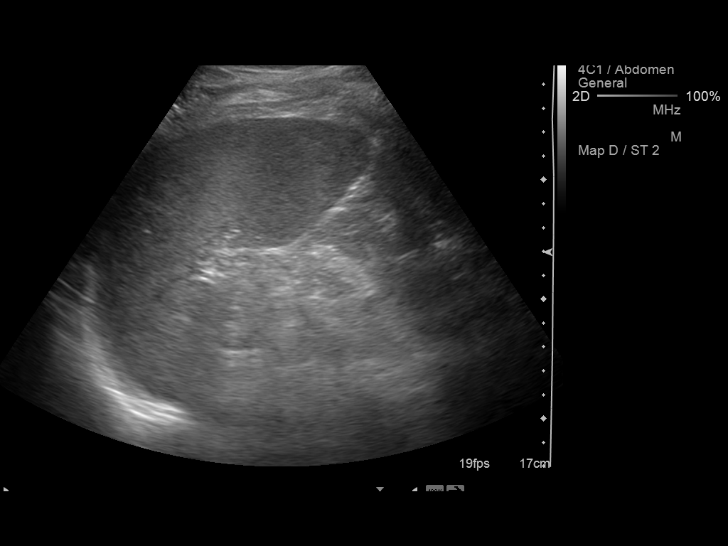
[im 95/104]
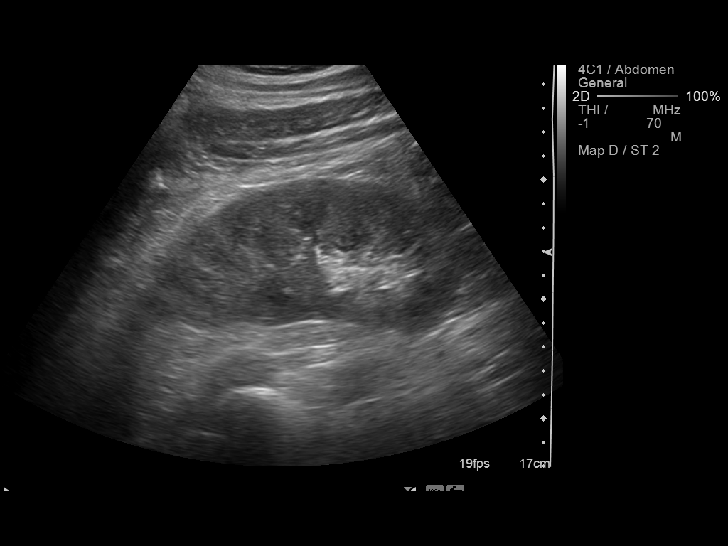
[im 104/104]
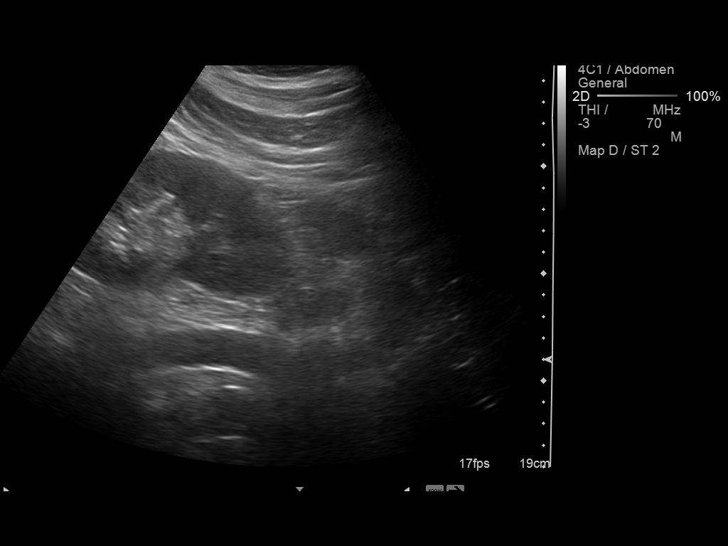

[13 of 25 positions shown; findings below may reference images not displayed]

FINDINGS: Gallbladder:  No gallstones, gallbladder wall thickening, or
pericholecystic fluid. Tiny focus of free fluid adjacent to the
gallbladder/liver edge measuring about 8 mm diameter.

Common bile duct:  Normal caliber with measured diameter of about 5
mm.

Liver:  The liver demonstrates a diffusely nodular parenchymal
echotexture pattern with multiple hypoechoic masses demonstrated
throughout the liver, and measuring up to about 5.2 cm maximal
diameter.  The appearance is suggestive of diffuse hepatic
metastasis.

IVC:  Appears normal.

Pancreas:  Most of the pancreas is obscured by overlying bowel gas.
Visualized portions of the body and tail are unremarkable.

Spleen:  Spleen length measures 12.5 cm.  Normal homogeneous
parenchymal echotexture.

Right Kidney:  Right kidney measures 13.7 cm length.  No
hydronephrosis.

Left Kidney:  Left kidney measures 14.3 cm length.  No
hydronephrosis.

Abdominal aorta:  Portions of the abdominal aorta are obscured due
to overlying bowel gas.  Visualized portions are not dilated.
IMPRESSION: Multiple hypoechoic masses throughout the liver suggesting diffuse
hepatic metastatic disease.

## 2013-12-24 IMAGING — CR DG CHEST 2V
2 series · 2 of 2 positions shown · non-contrast
Comparison: 08/30/2010

CLINICAL DATA: Right-sided abdominal pain.  Fever.  History of high
blood pressure.

CHEST - 2 VIEW

[w chest pa]
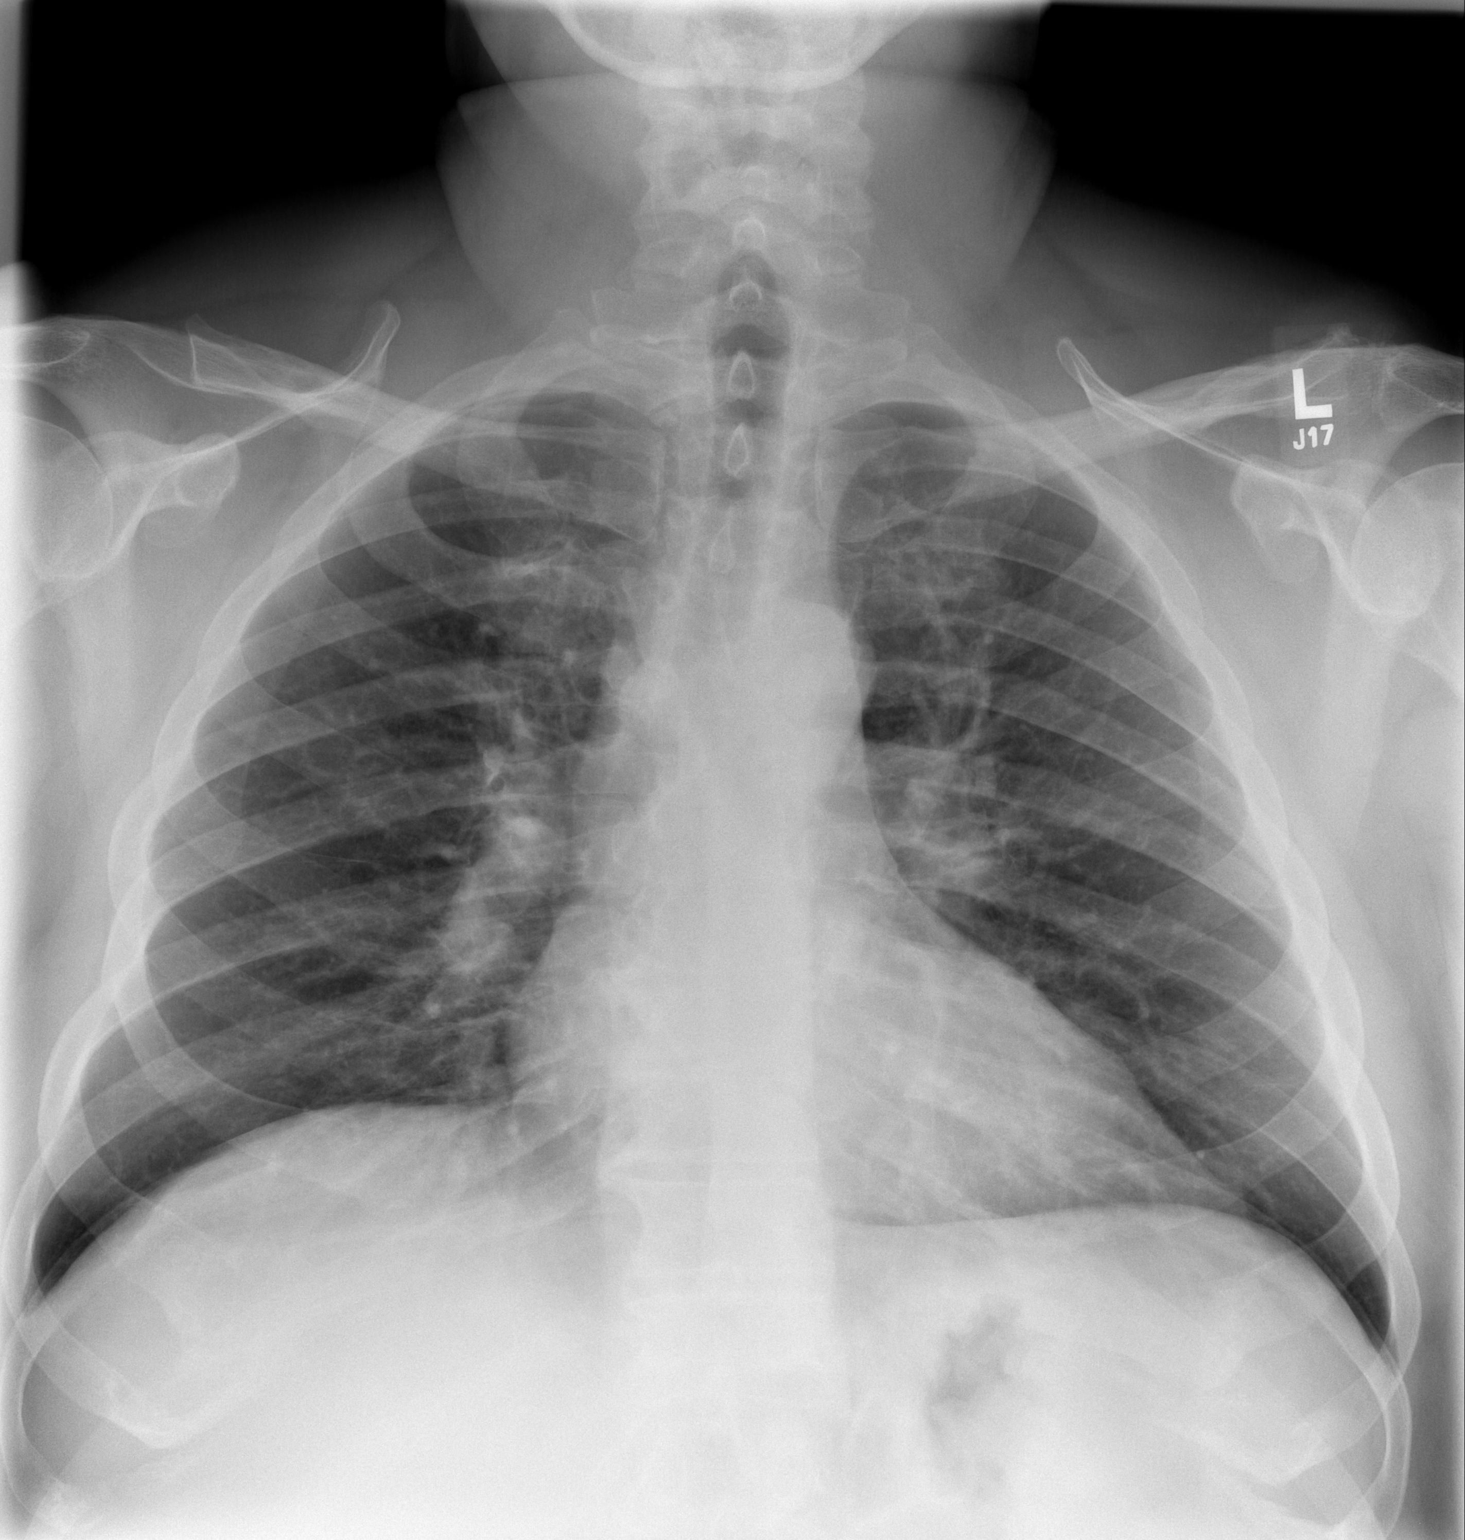

[w chest lat]
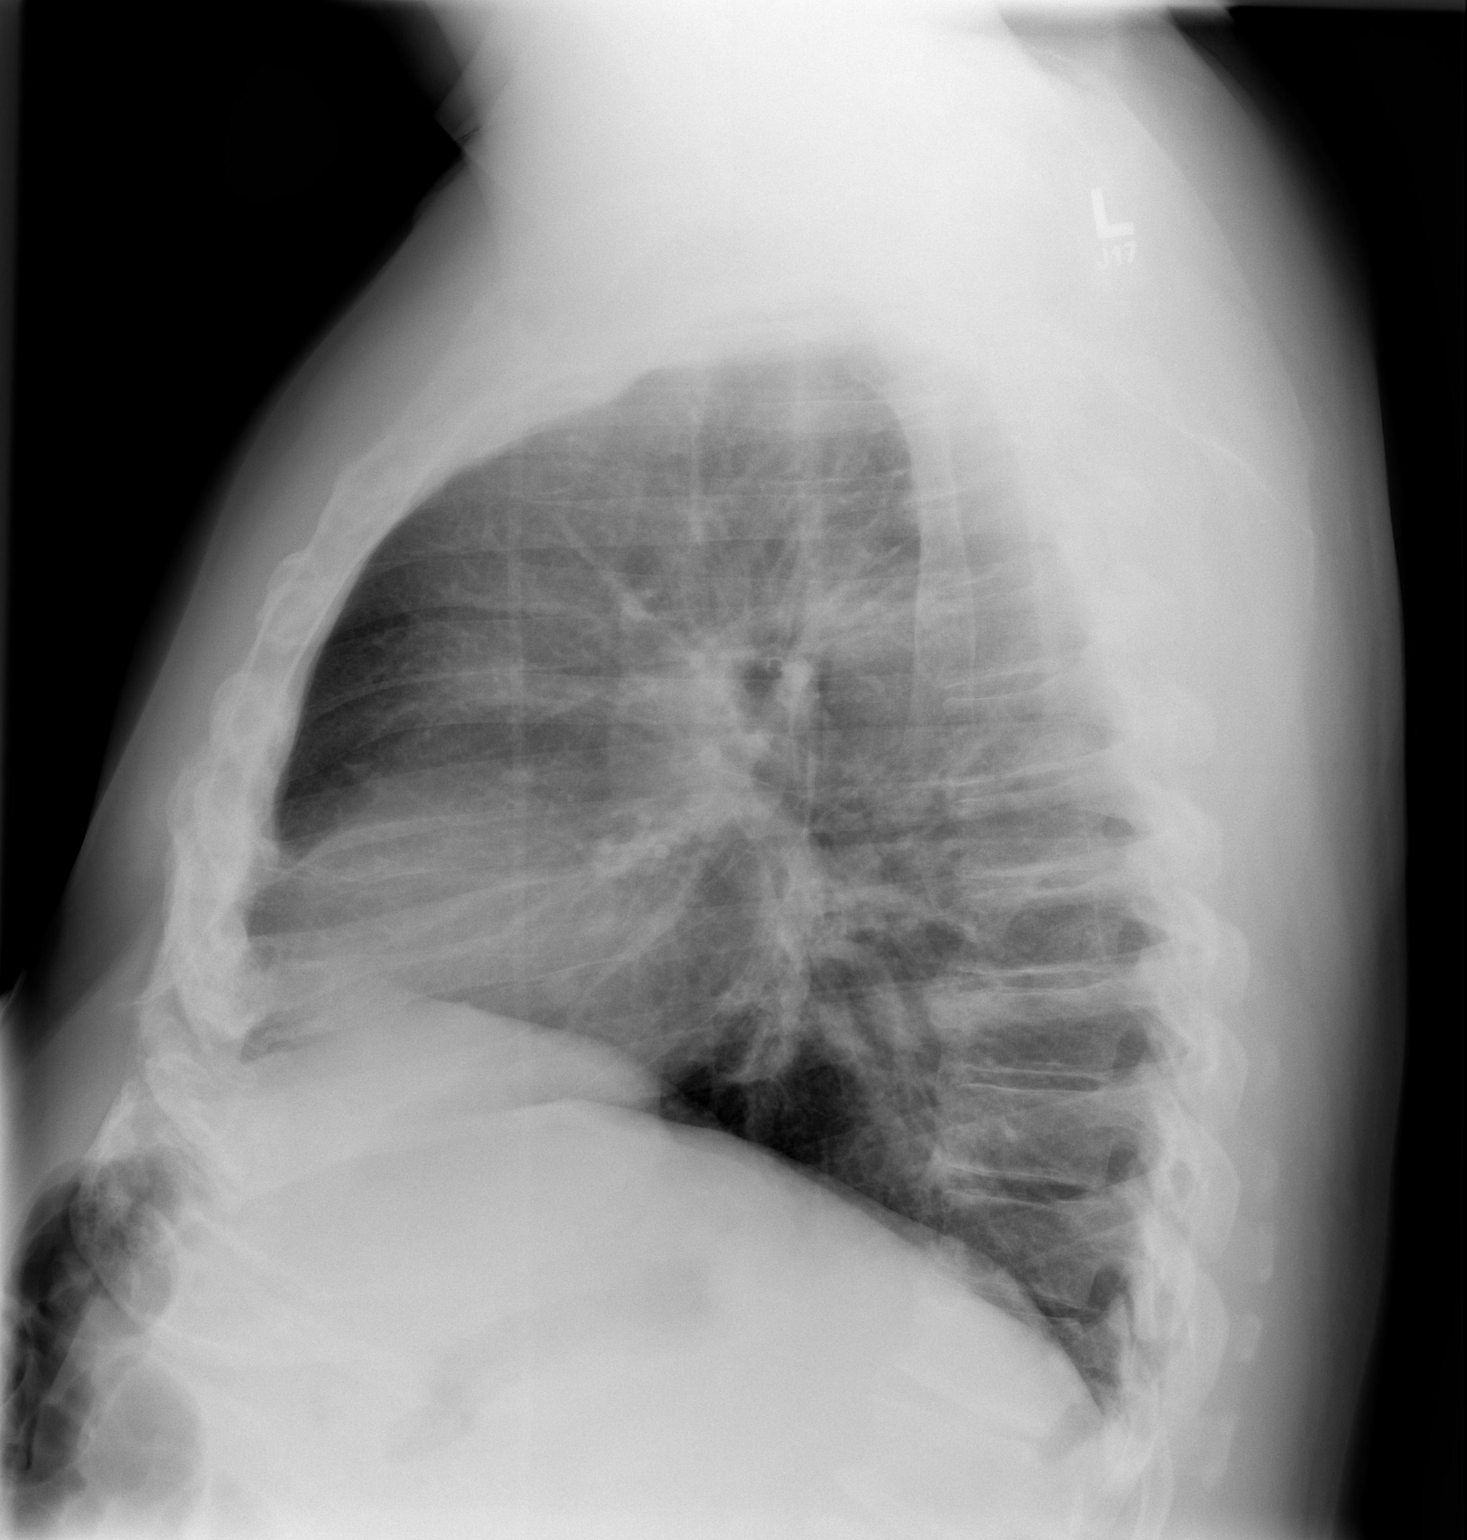

[2 of 2 positions shown; findings below may reference images not displayed]

FINDINGS: Slightly shallow inspiration.  Normal heart size and
pulmonary vascularity.  No focal airspace consolidation in the
lungs.  No blunting of costophrenic angles.  No pneumothorax.
Degenerative changes in the thoracic spine.  Old resection or
resorption of the distal right clavicle.  No significant change
since previous study.
IMPRESSION: No evidence of active pulmonary disease.

## 2013-12-27 IMAGING — US US BIOPSY
1 series · 13 of 17 positions shown · non-contrast
Comparison: none

Clinical Data/Indication: Liver lesions

ULTRASOUND-GUIDED OF A RIGHT LOBE LIVER LESION.  CORE.
Sedation: Versed 4 mg, Fentanyl 100 mcg.
Total Moderate Sedation Time: 20 minutes.
Procedure: The procedure, risks, benefits, and alternatives were
explained to the patient. Questions regarding the procedure were
encouraged and answered. The patient understands and consents to
the procedure.
The right flank was prepped with betadine in a sterile fashion, and
a sterile drape was applied covering the operative field. A mask
and sterile gloves were used for the procedure.
Under sonographic guidance, an 17 gauge needle was inserted into a
lesion within the right lobe of the liver.  Three 18 gauge core
biopsies were obtained. Final imaging was performed.
Patient tolerated the procedure well without complication.  Vital
sign monitoring by nursing staff during the procedure will continue
as patient is in the special procedures unit for post procedure
observation.

[Series 1: us biopsy · 0.35mm/px · 13 of 17 slices shown]
[im 1/17]
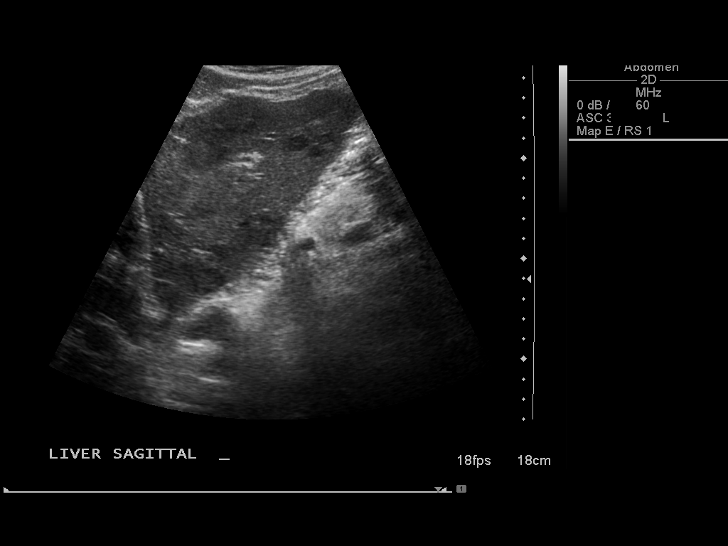
[im 2/17]
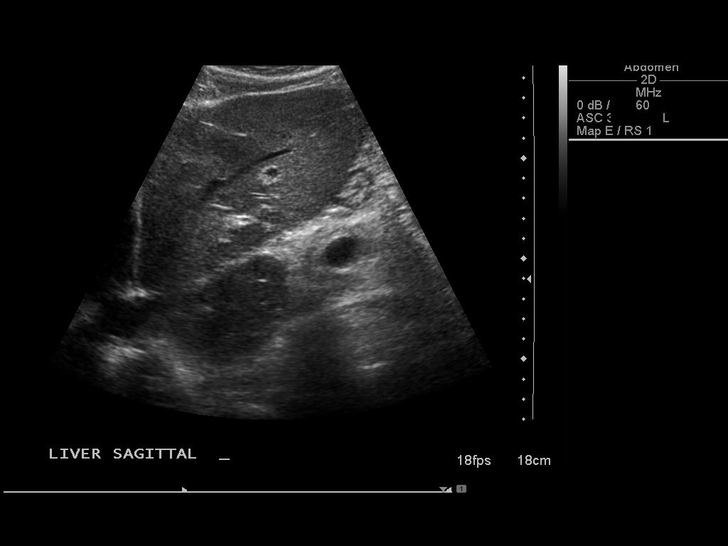
[im 4/17]
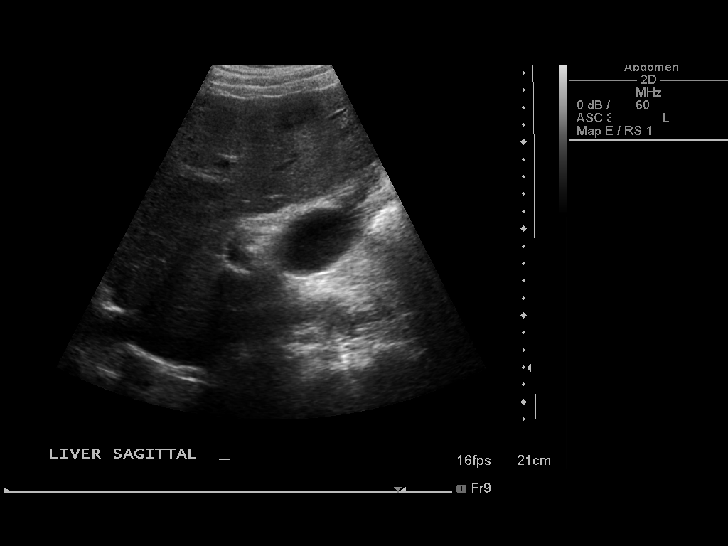
[im 5/17]
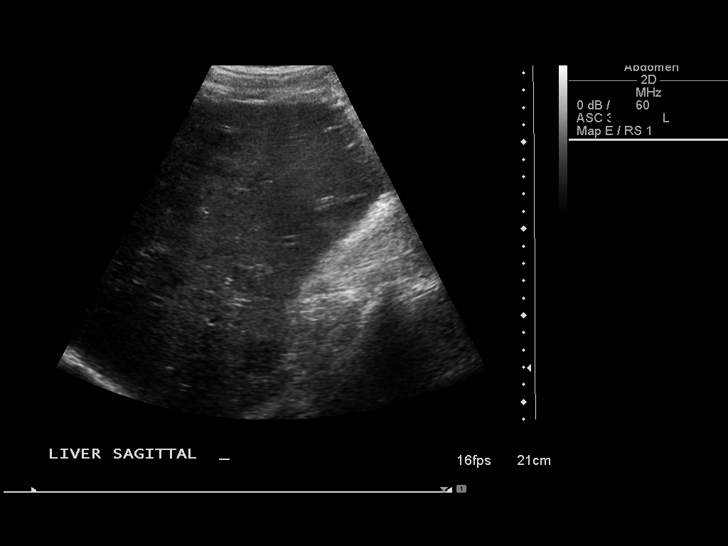
[im 6/17]
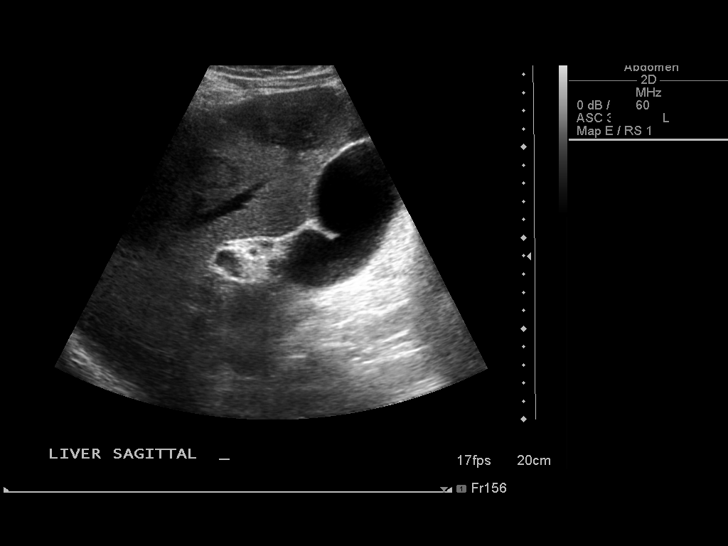
[im 8/17]
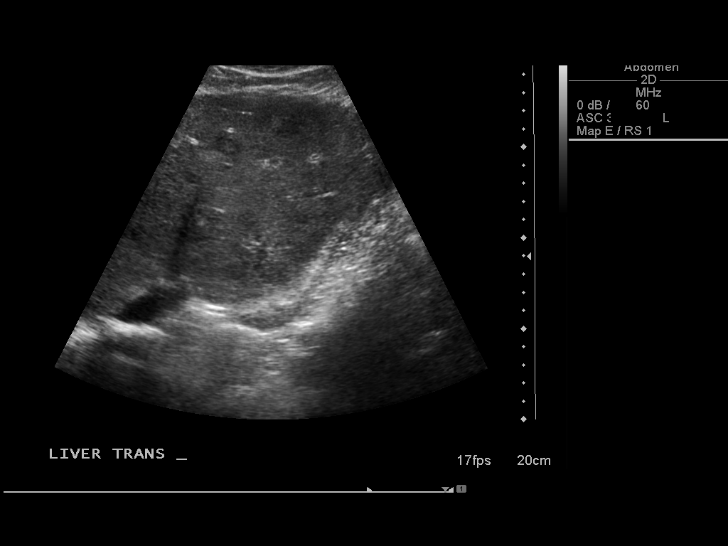
[im 9/17]
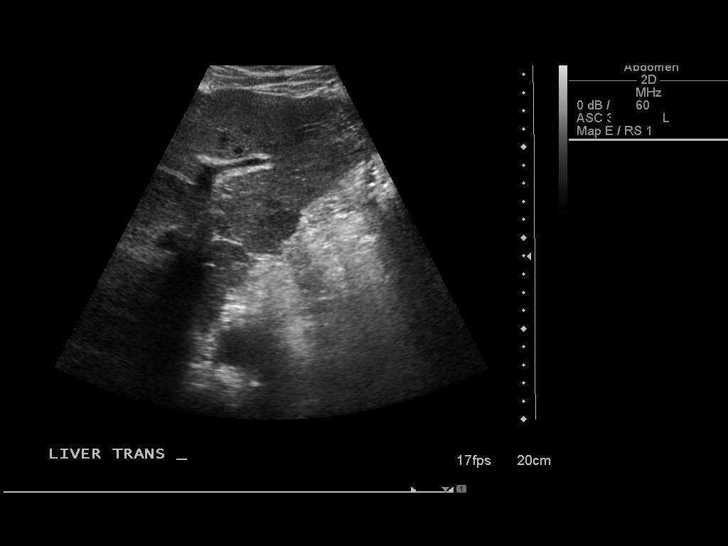
[im 10/17]
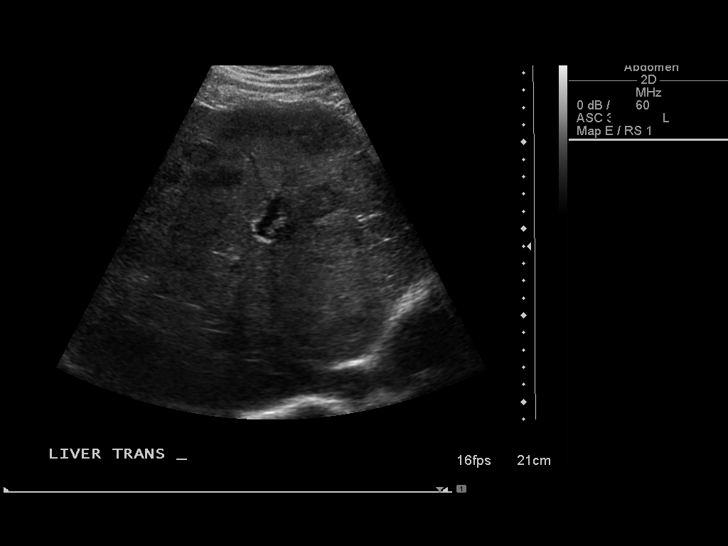
[im 12/17]
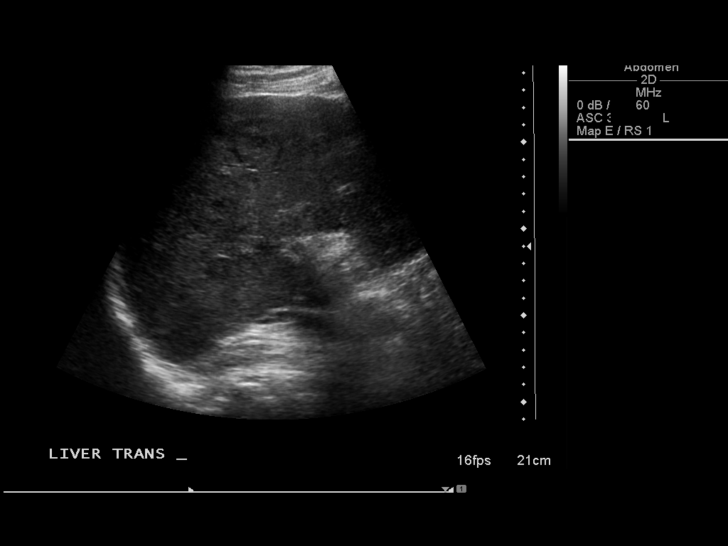
[im 13/17]
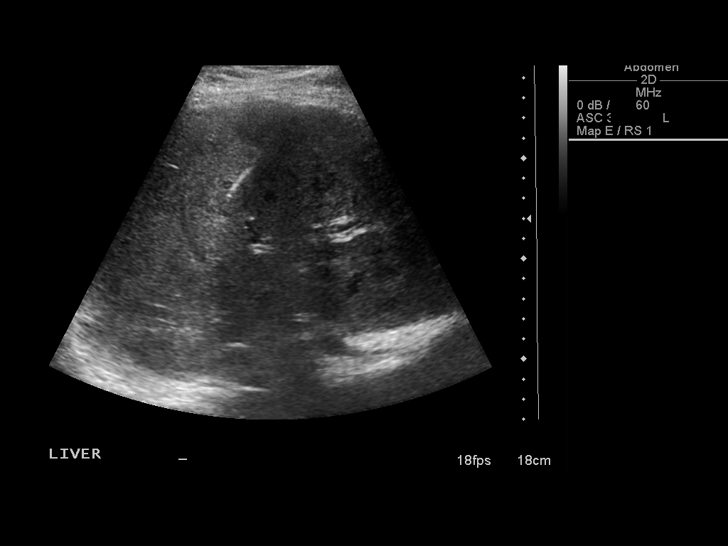
[im 14/17]
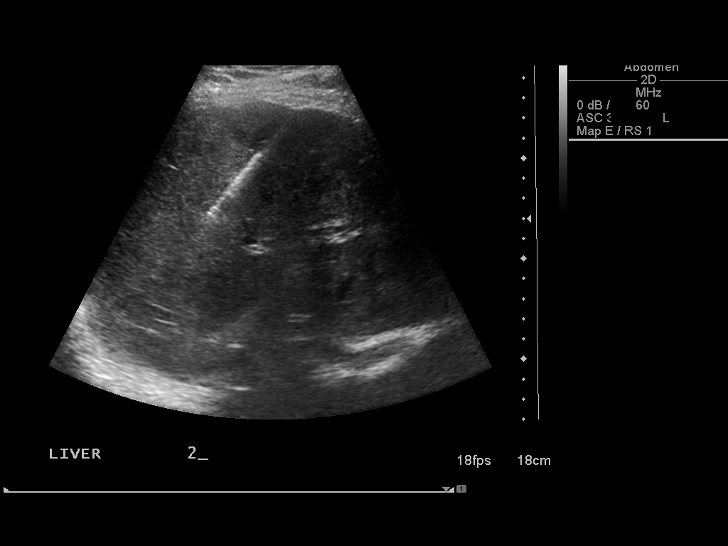
[im 16/17]
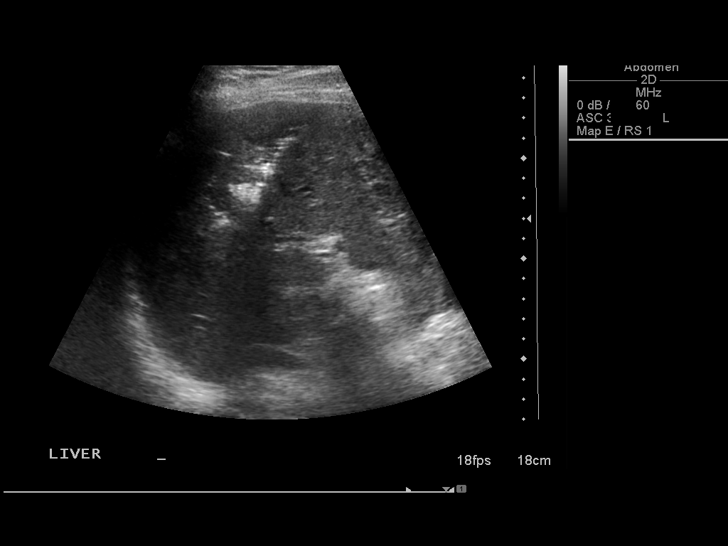
[im 17/17]
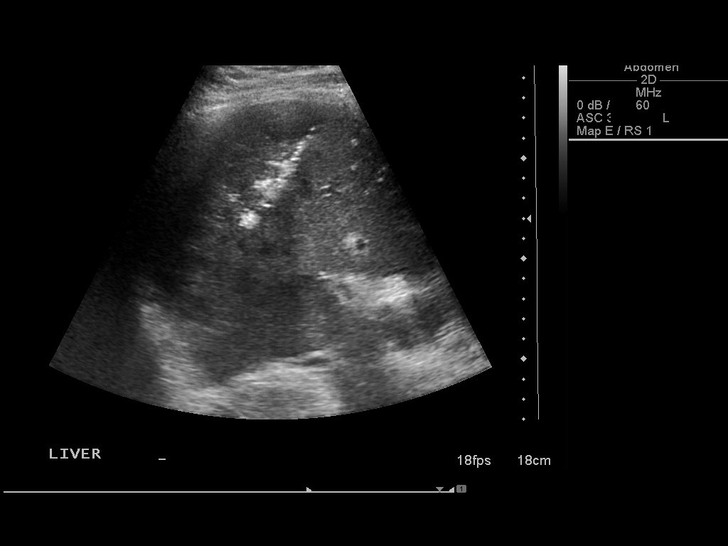

[13 of 17 positions shown; findings below may reference images not displayed]

FINDINGS: The images document guide needle placement within the
right lobe liver lesion. Post biopsy images demonstrate no
hemorrhage.
IMPRESSION: Successful ultrasound-guided core biopsy of a right lobe liver
lesion.
